# Patient Record
Sex: Female | Born: 1995
Health system: Southern US, Community
[De-identification: ages and names within clinical notes are randomized; demographics above are authoritative.]

## PROBLEM LIST (undated history)

## (undated) DIAGNOSIS — I2699 Other pulmonary embolism without acute cor pulmonale: Secondary | ICD-10-CM

## (undated) DIAGNOSIS — J020 Streptococcal pharyngitis: Secondary | ICD-10-CM

## (undated) DIAGNOSIS — J189 Pneumonia, unspecified organism: Secondary | ICD-10-CM

## (undated) DIAGNOSIS — K5190 Ulcerative colitis, unspecified, without complications: Secondary | ICD-10-CM

## (undated) HISTORY — PX: CARDIAC SURGERY: SHX584

---

## 2017-10-09 ENCOUNTER — Encounter (HOSPITAL_BASED_OUTPATIENT_CLINIC_OR_DEPARTMENT_OTHER): Payer: Self-pay | Admitting: *Deleted

## 2017-10-09 ENCOUNTER — Ambulatory Visit (HOSPITAL_BASED_OUTPATIENT_CLINIC_OR_DEPARTMENT_OTHER)
Admission: RE | Admit: 2017-10-09 | Discharge: 2017-10-09 | Disposition: A | Discharge status: Home / Self Care | Payer: 59 | Source: Ambulatory Visit | Attending: Emergency Medicine | Admitting: Emergency Medicine

## 2017-10-09 ENCOUNTER — Other Ambulatory Visit (HOSPITAL_BASED_OUTPATIENT_CLINIC_OR_DEPARTMENT_OTHER): Payer: Self-pay | Admitting: Emergency Medicine

## 2017-10-09 ENCOUNTER — Inpatient Hospital Stay (HOSPITAL_BASED_OUTPATIENT_CLINIC_OR_DEPARTMENT_OTHER)
Admission: EM | Admit: 2017-10-09 | Discharge: 2017-10-11 | DRG: 176 | Disposition: A | Discharge status: Home / Self Care | Payer: 59 | Attending: Family Medicine | Admitting: Family Medicine

## 2017-10-09 ENCOUNTER — Encounter (HOSPITAL_BASED_OUTPATIENT_CLINIC_OR_DEPARTMENT_OTHER): Payer: Self-pay

## 2017-10-09 DIAGNOSIS — Z880 Allergy status to penicillin: Secondary | ICD-10-CM | POA: Diagnosis not present

## 2017-10-09 DIAGNOSIS — R0602 Shortness of breath: Secondary | ICD-10-CM | POA: Diagnosis not present

## 2017-10-09 DIAGNOSIS — I2692 Saddle embolus of pulmonary artery without acute cor pulmonale: Secondary | ICD-10-CM | POA: Diagnosis present

## 2017-10-09 DIAGNOSIS — J9811 Atelectasis: Secondary | ICD-10-CM | POA: Diagnosis present

## 2017-10-09 DIAGNOSIS — J90 Pleural effusion, not elsewhere classified: Secondary | ICD-10-CM | POA: Diagnosis present

## 2017-10-09 DIAGNOSIS — Z79899 Other long term (current) drug therapy: Secondary | ICD-10-CM | POA: Diagnosis not present

## 2017-10-09 DIAGNOSIS — J181 Lobar pneumonia, unspecified organism: Secondary | ICD-10-CM | POA: Diagnosis not present

## 2017-10-09 DIAGNOSIS — D509 Iron deficiency anemia, unspecified: Secondary | ICD-10-CM | POA: Diagnosis present

## 2017-10-09 DIAGNOSIS — I2699 Other pulmonary embolism without acute cor pulmonale: Secondary | ICD-10-CM | POA: Diagnosis not present

## 2017-10-09 DIAGNOSIS — R0789 Other chest pain: Secondary | ICD-10-CM

## 2017-10-09 DIAGNOSIS — E871 Hypo-osmolality and hyponatremia: Secondary | ICD-10-CM | POA: Diagnosis present

## 2017-10-09 DIAGNOSIS — R7989 Other specified abnormal findings of blood chemistry: Secondary | ICD-10-CM

## 2017-10-09 DIAGNOSIS — D649 Anemia, unspecified: Secondary | ICD-10-CM | POA: Diagnosis not present

## 2017-10-09 DIAGNOSIS — J189 Pneumonia, unspecified organism: Secondary | ICD-10-CM

## 2017-10-09 DIAGNOSIS — K5190 Ulcerative colitis, unspecified, without complications: Secondary | ICD-10-CM | POA: Diagnosis present

## 2017-10-09 HISTORY — DX: Ulcerative colitis, unspecified, without complications: K51.90

## 2017-10-09 HISTORY — DX: Pneumonia, unspecified organism: J18.9

## 2017-10-09 LAB — COMPREHENSIVE METABOLIC PANEL
ALT: 12 U/L — AB (ref 14–54)
AST: 15 U/L (ref 15–41)
Albumin: 3.8 g/dL (ref 3.5–5.0)
Alkaline Phosphatase: 88 U/L (ref 38–126)
Anion gap: 8 (ref 5–15)
BILIRUBIN TOTAL: 0.3 mg/dL (ref 0.3–1.2)
BUN: 13 mg/dL (ref 6–20)
CHLORIDE: 102 mmol/L (ref 101–111)
CO2: 24 mmol/L (ref 22–32)
CREATININE: 0.69 mg/dL (ref 0.44–1.00)
Calcium: 9.2 mg/dL (ref 8.9–10.3)
GFR calc Af Amer: >60 mL/min (ref >60)
GLUCOSE: 90 mg/dL (ref 65–99)
Potassium: 3.7 mmol/L (ref 3.5–5.1)
Sodium: 134 mmol/L — ABNORMAL LOW (ref 135–145)
Total Protein: 8 g/dL (ref 6.5–8.1)

## 2017-10-09 LAB — OCCULT BLOOD X 1 CARD TO LAB, STOOL: Fecal Occult Bld: NEGATIVE

## 2017-10-09 LAB — HCG, QUANTITATIVE, PREGNANCY: HCG, BETA CHAIN, QUANT, S: 1 m[IU]/mL (ref <5)

## 2017-10-09 MED ORDER — ACETAMINOPHEN 325 MG PO TABS
650.0000 mg | ORAL_TABLET | Freq: Four times a day (QID) | ORAL | Status: DC | PRN
  Administered 2017-10-11: 650 mg via ORAL
  Filled 2017-10-09: qty 2

## 2017-10-09 MED ORDER — IOPAMIDOL (ISOVUE-370) INJECTION 76%
100.0000 mL | Freq: Once | INTRAVENOUS | Status: AC | PRN
  Administered 2017-10-09: 100 mL via INTRAVENOUS

## 2017-10-09 MED ORDER — HEPARIN BOLUS VIA INFUSION
4000.0000 [IU] | Freq: Once | INTRAVENOUS | Status: AC
Start: 2017-10-09 — End: 2017-10-09
  Administered 2017-10-09: 4000 [IU] via INTRAVENOUS

## 2017-10-09 MED ORDER — SODIUM CHLORIDE 0.9 % IV SOLN
INTRAVENOUS | Status: DC
  Administered 2017-10-09: 22:00:00 via INTRAVENOUS

## 2017-10-09 MED ORDER — HEPARIN (PORCINE) IN NACL 100-0.45 UNIT/ML-% IJ SOLN
1200.0000 [IU]/h | INTRAMUSCULAR | Status: DC
  Administered 2017-10-09: 1200 [IU]/h via INTRAVENOUS
  Filled 2017-10-09: qty 250

## 2017-10-09 MED ORDER — MESALAMINE 1.2 G PO TBEC
1.2000 g | DELAYED_RELEASE_TABLET | Freq: Every day | ORAL | Status: DC
  Filled 2017-10-09: qty 1

## 2017-10-09 MED ORDER — ACETAMINOPHEN 650 MG RE SUPP: 650.0000 mg | Freq: Four times a day (QID) | RECTAL | Status: DC | PRN

## 2017-10-09 NOTE — ED Notes (Signed)
[facility]. No distress. texting with her legs up resting on chair.

## 2017-10-09 NOTE — Progress Notes (Signed)
ANTICOAGULATION CONSULT NOTE - Initial Consult  Pharmacy Consult for heparin Indication: pulmonary embolus  Allergies  Allergen Reactions  . Penicillins Hives    Patient Measurements: Height: 5\' 5"  (165.1 cm) Weight: 165 lb (74.8 kg) IBW/kg (Calculated) : 57 Heparin Dosing Weight: 72.3kg  Vital Signs: Temp: 97.8 F (36.6 C) (11/02 1602) Temp Source: Oral (11/02 1602) BP: 129/78 (11/02 1808) Pulse Rate: 97 (11/02 1808)  Labs:  Recent Labs  10/09/17 1659  HGB 10.3*  HCT 34.2*  PLT 510*  CREATININE 0.69    Estimated Creatinine Clearance: 114.5 mL/min (by C-G formula based on SCr of 0.69 mg/dL).   Medical History: Past Medical History:  Diagnosis Date  . Pneumonia 10/09/2017  . Ulcerative colitis (HCC) 10/09/2017    Medications:  Infusions:  . heparin      Assessment: 21 yof presented to the ED with SOB and known PE. To start IV heparin. Baseline H/H slightly low and platelets are elevated. She is not on anticoagulation PTA.   Goal of Therapy:  Heparin level 0.3-0.7 units/ml Monitor platelets by anticoagulation protocol: Yes   Plan:  Heparin bolus 4000 units IV x 1 Heparin gtt 1200 units/hr Check a 6 hr heparin level Daily heparin level and CBC  Khy Pitre, Drake Leachachel Lynn 10/09/2017,7:02 PM

## 2017-10-09 NOTE — ED Notes (Signed)
Pt ambulated to restroom. 

## 2017-10-09 NOTE — ED Notes (Signed)
Patient denies pain and is resting comfortably.  

## 2017-10-09 NOTE — H&P (Addendum)
TRH H&P   Patient Demographics:    Meagan Clark, is a 21 y.o. female  MRN: [id number]   DOB - Jun 06, 1996  Admit Date - 10/09/2017  Outpatient Primary MD for the patient is Patient, No Pcp Per  Referring MD/NP/PA:  Drema Pry  Outpatient Specialists:   Patient coming from: home  Chief Complaint  Patient presents with  . Shortness of Breath  . Known PE      HPI:    Meagan Clark  is a 21 y.o. female,w ulcerative colitis, w recent dx of pneumonia 9/30 apparently c/o chest pain with breathing and had a ? D dimer test that was positive and sent for CTA chest r/o PE.   CTA chest  IMPRESSION: 1. Pulmonary artery embolism occluding the right main pulmonary artery extending into the branch vessels. 2. Associated atelectasis and possible posterior right lower lobe pulmonary infarct. 3. Right pleural effusion is likely reactive. 4. Pulmonary embolus extends 1 cm into the left main pulmonary artery without occlusion. No definite distal emboli are present on the left. 5. No evidence for right heart strain.  Pt sent to ED for evaluation of PE.    In ED,  Wbc 14.2, Hgb 10.3, Plt 510 Na 134, Bun 13, Creatinne 0.69,  Ast 15, Alt 12  Hemeoccult stool negative  Pt will be admitted for pulmonary embolism      Review of systems:    In addition to the HPI above,    No Fever-chills, No Headache, No changes with Vision or hearing, No problems swallowing food or Liquids, No Chest pain, Cough or Shortness of Breath, No Abdominal pain, No Nausea or Vommitting, Bowel movements are regular, No Blood in stool or Urine, No dysuria, No new skin rashes or bruises, No new joints pains-aches,  No new weakness, tingling, numbness in any extremity, No recent weight gain or loss, No polyuria, polydypsia or polyphagia, No significant Mental Stressors.  A full 10 point  Review of Systems was done, except as stated above, all other Review of Systems were negative.   With Past History of the following :    Past Medical History:  Diagnosis Date  . Pneumonia 10/09/2017  . Ulcerative colitis (HCC) 10/09/2017      History reviewed. No pertinent surgical history.    Social History:     Social History  Substance Use Topics  . Smoking status: Never Smoker  . Smokeless tobacco: Never Used  . Alcohol use No     Lives -  At home  Mobility -   Walks by self   Family History :    No family history on file.    Home Medications:   Prior to Admission medications   Medication Sig Start Date End Date Taking? Authorizing Provider  mesalamine (LIALDA) 1.2 g EC tablet Take by mouth daily with breakfast.   Yes [provider]  Allergies:     Allergies  Allergen Reactions  . Penicillins Hives     Physical Exam:   Vitals  Blood pressure (!) 144/82, pulse 88, temperature 98.6 F (37 C), temperature source Oral, resp. rate 13, height 5\' 5"  (1.651 m), weight 72.9 kg (160 lb 11.5 oz), last menstrual period 09/21/2017, SpO2 98 %.   1. General  lying in bed in NAD,    2. Normal affect and insight, Not Suicidal or Homicidal, Awake Alert, Oriented X 3.  3. No F.N deficits, ALL C.Nerves Intact, Strength 5/5 all 4 extremities, Sensation intact all 4 extremities, Plantars down going.  4. Ears and Eyes appear Normal, Conjunctivae clear, PERRLA. Moist Oral Mucosa.  5. Supple Neck, No JVD, No cervical lymphadenopathy appriciated, No Carotid Bruits.  6. Symmetrical Chest wall movement, Good air movement bilaterally, CTAB.  7. RRR, No Gallops, Rubs or Murmurs, No Parasternal Heave.  8. Positive Bowel Sounds, Abdomen Soft, No tenderness, No organomegaly appriciated,No rebound -guarding or rigidity.  9.  No Cyanosis, Normal Skin Turgor, No Skin Rash or Bruise.  10. Good muscle tone,  joints appear normal , no effusions, Normal  ROM.  11. No Palpable Lymph Nodes in Neck or Axillae     Data Review:    CBC  Recent Labs Lab 10/09/17 1659  WBC 14.2*  HGB 10.3*  HCT 34.2*  PLT 510*  MCV 67.3*  MCH 20.3*  MCHC 30.1  RDW 17.8*  LYMPHSABS 3.1  MONOABS 0.7  EOSABS 0.8*  BASOSABS 0   ------------------------------------------------------------------------------------------------------------------  Chemistries   Recent Labs Lab 10/09/17 1659  NA 134*  K 3.7  CL 102  CO2 24  GLUCOSE 90  BUN 13  CREATININE 0.69  CALCIUM 9.2  AST 15  ALT 12*  ALKPHOS 88  BILITOT 0.3   ------------------------------------------------------------------------------------------------------------------ estimated creatinine clearance is 113.2 mL/min (by C-G formula based on SCr of 0.69 mg/dL). ------------------------------------------------------------------------------------------------------------------ No results for input(s): TSH, T4TOTAL, T3FREE, THYROIDAB in the last 72 hours.  Invalid input(s): FREET3  Coagulation profile No results for input(s): INR, PROTIME in the last 168 hours. ------------------------------------------------------------------------------------------------------------------- No results for input(s): DDIMER in the last 72 hours. -------------------------------------------------------------------------------------------------------------------  Cardiac Enzymes No results for input(s): CKMB, TROPONINI, MYOGLOBIN in the last 168 hours.  Invalid input(s): CK ------------------------------------------------------------------------------------------------------------------ No results found for: BNP   ---------------------------------------------------------------------------------------------------------------  Urinalysis No results found for: COLORURINE, APPEARANCEUR, LABSPEC, PHURINE, GLUCOSEU, HGBUR, BILIRUBINUR, KETONESUR, PROTEINUR, UROBILINOGEN, NITRITE,  LEUKOCYTESUR  ----------------------------------------------------------------------------------------------------------------   Imaging Results:    Ct Angio Chest Pe W Or Wo Contrast  Result Date: 10/09/2017 CLINICAL DATA:  Progressive shortness of breath. Chest pressure. Positive D-dimer. EXAM: CT ANGIOGRAPHY CHEST WITH CONTRAST TECHNIQUE: Multidetector CT imaging of the chest was performed using the standard protocol during bolus administration of intravenous contrast. Multiplanar CT image reconstructions and MIPs were obtained to evaluate the vascular anatomy. CONTRAST:  100 mL Isovue 370 COMPARISON:  None. FINDINGS: Cardiovascular: The heart size is normal. There is no evidence for right heart strain. Aorta and great vessel origins are within normal limits. Pulmonary artery opacification is excellent. There is a large embolus occluding the right main pulmonary artery and extending into the lobar and segmental branch vessels on the right. The embolus spans the carina extending into the left main pulmonary artery without proximal occlusion. No definite distal emboli are present. Mediastinum/Nodes: No significant mediastinal or axillary adenopathy is present. The esophagus is within normal limits. Lungs/Pleura: Scattered areas of ground-glass attenuation are present in the right lung, likely reflect atelectasis.  There is focal peripheral based airspace disease in the posterior right lower lobe 16 mm, likely pulmonary infarct. A right pleural effusion is present. The left lung is clear. Upper Abdomen: Limited imaging the abdomen is unremarkable. Musculoskeletal: No chest wall abnormality. No acute or significant osseous findings. Review of the MIP images confirms the above findings. IMPRESSION: 1. Pulmonary artery embolism occluding the right main pulmonary artery extending into the branch vessels. 2. Associated atelectasis and possible posterior right lower lobe pulmonary infarct. 3. Right pleural effusion  is likely reactive. 4. Pulmonary embolus extends 1 cm into the left main pulmonary artery without occlusion. No definite distal emboli are present on the left. 5. No evidence for right heart strain. Critical Value/emergent results were called by telephone at the time of interpretation on 10/09/2017 at 3:27 pm to Dr. Sherre Poot , who verbally acknowledged these results. Electronically Signed   By: Marin Roberts M.D.   On: 10/09/2017 15:34      Assessment & Plan:    Principal Problem:   Pulmonary embolism (HCC) Active Problems:   Anemia   Hyponatremia   PE Check hypercoag panel Lower ext ultrasound Heparin iv Check cardiac echo  Hyponatremia Hydrate with ns iv Check cmp in am  Anemia Check cbc in am  UC  Cont lialda   DVT Prophylaxis Heparin -  SCDs  AM Labs Ordered, also please review Full Orders  Family Communication: Admission, patients condition and plan of care including tests being ordered have been discussed with the patient  who indicate understanding and agree with the plan and Code Status.  Code Status FULL CODE  Likely DC to  home  Condition GUARDED   Consults called: none  Admission status: inpatient   Time spent in minutes : 45   Pearson Grippe M.D on 10/09/2017 at 9:44 PM  Between 7am to 7pm - Pager - [phone]  . After 7pm go to [email or web address] - password [facility]  [facility] - Office  [phone]

## 2017-10-09 NOTE — Progress Notes (Signed)
21 yo female with recent pneumonia has PE

## 2017-10-09 NOTE — ED Provider Notes (Signed)
[facility] EMERGENCY DEPARTMENT Provider Note  CSN: [id number] Arrival date & time: 10/09/17 1550  Chief Complaint(s) Shortness of Breath and Known PE  HPI Meagan Clark is a 19 y.o. female 21 year old female who presents to the emergency department with new diagnosis of pulmonary embolism noted on CT scan here at [facility] outpatient CT scanner.  Patient was seen by [facility] urgent care center for follow-up of several months of shortness of breath.  She reports that she was diagnosed with pneumonia in late September and reported that her shortness of breath had not improved so she went there for follow-up.  They did blood work that was concerning for PE and ordered a CT scan.  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  She lives in [place] and has been traveling back home almost every month.  She also endorses recently being diagnosed with ulcerative colitis that has been well controlled with immunosuppressive medication.  She endorses pleuritic chest pain with deep breathing.  Also endorses dyspnea on exertion.  Currently she is asymptomatic.  Denies any history of oral contraceptives or personal history of cancer.    HPI  Past Medical History Past Medical History:  Diagnosis Date  . Pneumonia 10/09/2017  . Ulcerative colitis (HCC) 10/09/2017   Patient Active Problem List   Diagnosis Date Noted  . Ulcerative colitis (HCC) 10/09/2017   Home Medication(s) Prior to Admission medications   Medication Sig Start Date End Date Taking? Authorizing Provider  mesalamine (LIALDA) 1.2 g EC tablet Take by mouth daily with breakfast.   Yes [provider]                                                                                                                                    Past Surgical History History reviewed. No pertinent surgical history. Family History No family history on file.  Social History Social History    Substance Use Topics  . Smoking status: Never Smoker  . Smokeless tobacco: Never Used  . Alcohol use No   Allergies Penicillins  Review of Systems Review of Systems All other systems are reviewed and are negative for acute change except as noted in the HPI  Physical Exam Vital Signs  I have reviewed the triage vital signs BP 118/72 (BP Location: Left Arm)   Pulse 98   Temp 97.8 F (36.6 C) (Oral)   Resp 20   Ht 5\' 5"  (1.651 m)   Wt 74.8 kg (165 lb)   LMP 09/21/2017   SpO2 98%   BMI 27.46 kg/m   Physical Exam  Constitutional: She is oriented to person, place, and time. She appears well-developed and well-nourished. No distress.  HENT:  Head: Normocephalic and atraumatic.  Nose: Nose normal.  Eyes: Pupils are equal, round, and reactive to light. Conjunctivae and EOM are normal. Right eye exhibits no discharge. Left eye exhibits  no discharge. No scleral icterus.  Neck: Normal range of motion. Neck supple.  Cardiovascular: Normal rate and regular rhythm.  Exam reveals no gallop and no friction rub.   Murmur heard.  Systolic ( Flow murmur best heard in right lower sternal border) murmur is present  Pulmonary/Chest: Effort normal and breath sounds normal. No stridor. No respiratory distress. She has no rales.  Abdominal: Soft. She exhibits no distension. There is no tenderness.  Musculoskeletal: She exhibits no edema or tenderness.  Neurological: She is alert and oriented to person, place, and time.  Skin: Skin is warm and dry. No rash noted. She is not diaphoretic. No erythema.  Psychiatric: She has a normal mood and affect.  Vitals reviewed.   ED Results and Treatments Labs (all labs ordered are listed, but only abnormal results are displayed) Labs Reviewed  CBC WITH DIFFERENTIAL/PLATELET - Abnormal; Notable for the following:       Result Value   WBC 14.2 (*)    Hemoglobin 10.3 (*)    HCT 34.2 (*)    MCV 67.3 (*)    MCH 20.3 (*)    RDW 17.8 (*)    Platelets  510 (*)    Neutro Abs 9.4 (*)    Eosinophils Absolute 0.8 (*)    All other components within normal limits  COMPREHENSIVE METABOLIC PANEL - Abnormal; Notable for the following:    Sodium 134 (*)    ALT 12 (*)    All other components within normal limits  HCG, QUANTITATIVE, PREGNANCY  OCCULT BLOOD X 1 CARD TO LAB, STOOL                                                                                                                         EKG  EKG Interpretation  Date/Time:    Ventricular Rate:    PR Interval:    QRS Duration:   QT Interval:    QTC Calculation:   R Axis:     Text Interpretation:        Radiology Ct Angio Chest Pe W Or Wo Contrast  Result Date: 10/09/2017 CLINICAL DATA:  Progressive shortness of breath. Chest pressure. Positive D-dimer. EXAM: CT ANGIOGRAPHY CHEST WITH CONTRAST TECHNIQUE: Multidetector CT imaging of the chest was performed using the standard protocol during bolus administration of intravenous contrast. Multiplanar CT image reconstructions and MIPs were obtained to evaluate the vascular anatomy. CONTRAST:  100 mL Isovue 370 COMPARISON:  None. FINDINGS: Cardiovascular: The heart size is normal. There is no evidence for right heart strain. Aorta and great vessel origins are within normal limits. Pulmonary artery opacification is excellent. There is a large embolus occluding the right main pulmonary artery and extending into the lobar and segmental branch vessels on the right. The embolus spans the carina extending into the left main pulmonary artery without proximal occlusion. No definite distal emboli are present. Mediastinum/Nodes: No significant mediastinal or axillary adenopathy is present. The esophagus is within normal limits. Lungs/Pleura: Scattered areas  of ground-glass attenuation are present in the right lung, likely reflect atelectasis. There is focal peripheral based airspace disease in the posterior right lower lobe 16 mm, likely pulmonary infarct.  A right pleural effusion is present. The left lung is clear. Upper Abdomen: Limited imaging the abdomen is unremarkable. Musculoskeletal: No chest wall abnormality. No acute or significant osseous findings. Review of the MIP images confirms the above findings. IMPRESSION: 1. Pulmonary artery embolism occluding the right main pulmonary artery extending into the branch vessels. 2. Associated atelectasis and possible posterior right lower lobe pulmonary infarct. 3. Right pleural effusion is likely reactive. 4. Pulmonary embolus extends 1 cm into the left main pulmonary artery without occlusion. No definite distal emboli are present on the left. 5. No evidence for right heart strain. Critical Value/emergent results were called by telephone at the time of interpretation on 10/09/2017 at 3:27 pm to Dr. Sherre PootLAURENCE SCHLANGER , who verbally acknowledged these results. Electronically Signed   By: Marin Robertshristopher  Mattern M.D.   On: 10/09/2017 15:34   Pertinent labs & imaging results that were available during my care of the patient were reviewed by me and considered in my medical decision making (see chart for details).  Medications Ordered in ED Medications  heparin bolus via infusion 4,000 Units (not administered)  heparin ADULT infusion 100 units/mL (25000 units/24550mL sodium chloride 0.45%) (not administered)                                                                                                                                    Procedures Procedures  CRITICAL CARE Performed by: Amadeo GarnetPedro Eduardo Melton Walls Total critical care time: 30 minutes Critical care time was exclusive of separately billable procedures and treating other patients. Critical care was necessary to treat or prevent imminent or life-threatening deterioration. Critical care was time spent personally by me on the following activities: development of treatment plan with patient and/or surrogate as well as nursing, discussions with consultants,  evaluation of patient's response to treatment, examination of patient, obtaining history from patient or surrogate, ordering and performing treatments and interventions, ordering and review of laboratory studies, ordering and review of radiographic studies, pulse oximetry and re-evaluation of patient's condition.   (including critical care time)  Medical Decision Making / ED Course I have reviewed the nursing notes for this encounter and the patient's prior records (if available in EHR or on provided paperwork).    Known PE on CT scan.  Patient is hemodynamically stable satting well on room air.  Given her lack of close follow-up and history of ulcerative colitis, patient is not appropriate for discharge with outpatient management.  Will obtain screening labs to assess for any thrombocytopenia or renal insufficiency.  We will also obtain Hemoccult and UPT.  If screening labs unremarkable patient will be started on heparin and admitted for further management.  Labs grossly reassuring without thrombocytopenia or renal.  Beta hCG negative.  Hemoccult negative.  Patient started on heparin drip.  Will discuss case with medicine for admission.  Final Clinical Impression(s) / ED Diagnoses Final diagnoses:  Acute saddle pulmonary embolism without acute cor pulmonale (HCC)      This chart was dictated using voice recognition software.  Despite best efforts to proofread,  errors can occur which can change the documentation meaning.   Nira Conn, MD 10/09/17 1904

## 2017-10-09 NOTE — ED Triage Notes (Signed)
SOB since September. She has been diagnosed with pneumonia in September. She was rechecked 2 days ago and had a CT today that showed a PE.

## 2017-10-10 ENCOUNTER — Inpatient Hospital Stay (HOSPITAL_COMMUNITY): Payer: 59

## 2017-10-10 DIAGNOSIS — R0602 Shortness of breath: Secondary | ICD-10-CM

## 2017-10-10 DIAGNOSIS — D649 Anemia, unspecified: Secondary | ICD-10-CM

## 2017-10-10 DIAGNOSIS — K5190 Ulcerative colitis, unspecified, without complications: Secondary | ICD-10-CM

## 2017-10-10 DIAGNOSIS — I2699 Other pulmonary embolism without acute cor pulmonale: Secondary | ICD-10-CM

## 2017-10-10 DIAGNOSIS — I2692 Saddle embolus of pulmonary artery without acute cor pulmonale: Principal | ICD-10-CM

## 2017-10-10 LAB — COMPREHENSIVE METABOLIC PANEL
ALBUMIN: 3.7 g/dL (ref 3.5–5.0)
ALT: 11 U/L — AB (ref 14–54)
AST: 17 U/L (ref 15–41)
Alkaline Phosphatase: 93 U/L (ref 38–126)
Anion gap: 9 (ref 5–15)
BILIRUBIN TOTAL: 0.5 mg/dL (ref 0.3–1.2)
BUN: 14 mg/dL (ref 6–20)
CHLORIDE: 103 mmol/L (ref 101–111)
CO2: 25 mmol/L (ref 22–32)
CREATININE: 0.74 mg/dL (ref 0.44–1.00)
Calcium: 9.1 mg/dL (ref 8.9–10.3)
GFR calc Af Amer: >60 mL/min (ref >60)
GLUCOSE: 103 mg/dL — AB (ref 65–99)
Potassium: 3.4 mmol/L — ABNORMAL LOW (ref 3.5–5.1)
Sodium: 137 mmol/L (ref 135–145)
Total Protein: 8.2 g/dL — ABNORMAL HIGH (ref 6.5–8.1)

## 2017-10-10 LAB — CBC
HCT: 33.3 % — ABNORMAL LOW (ref 36.0–46.0)
Hemoglobin: 9.9 g/dL — ABNORMAL LOW (ref 12.0–15.0)
MCH: 20.2 pg — ABNORMAL LOW (ref 26.0–34.0)
MCHC: 29.7 g/dL — AB (ref 30.0–36.0)
MCV: 68.1 fL — AB (ref 78.0–100.0)
PLATELETS: 522 10*3/uL — AB (ref 150–400)
RBC: 4.89 MIL/uL (ref 3.87–5.11)
RDW: 17.3 % — AB (ref 11.5–15.5)
WBC: 16.9 10*3/uL — AB (ref 4.0–10.5)

## 2017-10-10 LAB — HEPARIN LEVEL (UNFRACTIONATED)
Heparin Unfractionated: 0.34 IU/mL (ref 0.30–0.70)
Heparin Unfractionated: 0.4 IU/mL (ref 0.30–0.70)

## 2017-10-10 LAB — MRSA PCR SCREENING: MRSA BY PCR: NEGATIVE

## 2017-10-10 LAB — ECHOCARDIOGRAM COMPLETE
Height: 65 in
Weight: 2504.43 oz

## 2017-10-10 LAB — ANTITHROMBIN III: AntiThromb III Func: 101 % (ref 75–120)

## 2017-10-10 MED ORDER — MESALAMINE 1.2 G PO TBEC
2.4000 g | DELAYED_RELEASE_TABLET | Freq: Every day | ORAL | Status: DC
  Administered 2017-10-10 (×2): 2.4 g via ORAL
  Filled 2017-10-10 (×3): qty 2

## 2017-10-10 MED ORDER — NON FORMULARY: 3.0000 mg | Freq: Every evening | Status: DC | PRN

## 2017-10-10 MED ORDER — DIPHENHYDRAMINE HCL 50 MG/ML IJ SOLN
25.0000 mg | Freq: Four times a day (QID) | INTRAMUSCULAR | Status: DC | PRN
  Administered 2017-10-10 – 2017-10-11 (×2): 25 mg via INTRAVENOUS
  Filled 2017-10-10 (×2): qty 1

## 2017-10-10 MED ORDER — HYDROMORPHONE HCL 1 MG/ML IJ SOLN
0.5000 mg | INTRAMUSCULAR | Status: DC | PRN
  Administered 2017-10-10 – 2017-10-11 (×3): 0.5 mg via INTRAVENOUS
  Filled 2017-10-10 (×2): qty 1
  Filled 2017-10-10: qty 0.5

## 2017-10-10 MED ORDER — OXYCODONE HCL 5 MG PO TABS
5.0000 mg | ORAL_TABLET | ORAL | Status: DC | PRN
  Administered 2017-10-10 – 2017-10-11 (×3): 5 mg via ORAL
  Filled 2017-10-10 (×4): qty 1

## 2017-10-10 MED ORDER — HEPARIN (PORCINE) IN NACL 100-0.45 UNIT/ML-% IJ SOLN
1300.0000 [IU]/h | INTRAMUSCULAR | Status: DC
  Administered 2017-10-10 – 2017-10-11 (×3): 1300 [IU]/h via INTRAVENOUS
  Filled 2017-10-10 (×2): qty 250

## 2017-10-10 MED ORDER — HYDROCORTISONE 0.5 % EX CREA
TOPICAL_CREAM | Freq: Two times a day (BID) | CUTANEOUS | Status: DC | PRN
  Filled 2017-10-10: qty 28.35

## 2017-10-10 MED ORDER — MORPHINE SULFATE (PF) 4 MG/ML IV SOLN
2.0000 mg | Freq: Once | INTRAVENOUS | Status: AC
  Administered 2017-10-10: 2 mg via INTRAVENOUS
  Filled 2017-10-10: qty 1

## 2017-10-10 NOTE — Progress Notes (Signed)
[place] arrived [place] via [place] [address] [place].  Mother at bedside.  Telemetry placed per MD order and CMT notified.

## 2017-10-10 NOTE — Progress Notes (Signed)
ANTICOAGULATION CONSULT NOTE - f/u Consult  Pharmacy Consult for heparin Indication: pulmonary embolus  Allergies  Allergen Reactions  . Penicillins Hives    Has patient had a PCN reaction causing immediate rash, facial/tongue/throat swelling, SOB or lightheadedness with hypotension:no Has patient had a PCN reaction causing severe rash involve mucus membranes or skin necrosis: no Has patient had a PCN reaction that required hospitalization: no Has patient had a PCN reaction occurring within the last 10 years:no If all of the above answers are "NO", then may proceed with Cephalosporin use.     Patient Measurements: Height: 5\' 5"  (165.1 cm) Weight: 160 lb 11.5 oz (72.9 kg) IBW/kg (Calculated) : 57 Heparin Dosing Weight: 72.3kg  Vital Signs: Temp: 98.1 F (36.7 C) (11/03 0000) Temp Source: Oral (11/03 0000) BP: 114/71 (11/03 0000) Pulse Rate: 100 (11/03 0000)  Labs:  Recent Labs  10/09/17 1659 10/10/17 0151  HGB 10.3* 9.9*  HCT 34.2* 33.3*  PLT 510* 522*  HEPARINUNFRC  --  0.34  CREATININE 0.69 0.74    Estimated Creatinine Clearance: 113.2 mL/min (by C-G formula based on SCr of 0.74 mg/dL).   Medical History: Past Medical History:  Diagnosis Date  . Pneumonia 10/09/2017  . Ulcerative colitis (HCC) 10/09/2017    Medications:  Infusions:  . sodium chloride 75 mL/hr at 10/09/17 2220  . heparin      Assessment: 21 yof presented to the ED with SOB and known PE. To start IV heparin. Baseline H/H slightly low and platelets are elevated. She is not on anticoagulation PTA.  Today, 11/3 0151 HL= 0.34 at low end of goal, no infusion or bleeding issues per RN.   Goal of Therapy:  Heparin level 0.3-0.7 units/ml Monitor platelets by anticoagulation protocol: Yes   Plan:  Increase heparin drip to 1300 units/hr to ensure stays in therapeutic range as bolus wears off Check a 6 hr heparin level Daily heparin level and CBC  Susanne GreenhouseGreen, [place] Skalicky R 10/10/2017,4:00 AM

## 2017-10-10 NOTE — Progress Notes (Signed)
ANTICOAGULATION CONSULT NOTE - f/u Consult  Pharmacy Consult for heparin Indication: pulmonary embolus  Allergies  Allergen Reactions  . Penicillins Hives    Has patient had a PCN reaction causing immediate rash, facial/tongue/throat swelling, SOB or lightheadedness with hypotension:no Has patient had a PCN reaction causing severe rash involve mucus membranes or skin necrosis: no Has patient had a PCN reaction that required hospitalization: no Has patient had a PCN reaction occurring within the last 10 years:no If all of the above answers are "NO", then may proceed with Cephalosporin use.     Patient Measurements: Height: 5\' 5"  (165.1 cm) Weight: 156 lb 8.4 oz (71 kg) IBW/kg (Calculated) : 57 Heparin Dosing Weight: 72.3kg  Vital Signs: Temp: 97.6 F (36.4 C) (11/03 0800) Temp Source: Oral (11/03 0800) BP: 132/65 (11/03 0840) Pulse Rate: 82 (11/03 0840)  Labs:  Recent Labs  10/09/17 1659 10/10/17 0151 10/10/17 1102  HGB 10.3* 9.9*  --   HCT 34.2* 33.3*  --   PLT 510* 522*  --   HEPARINUNFRC  --  0.34 0.40  CREATININE 0.69 0.74  --     Estimated Creatinine Clearance: 111.8 mL/min (by C-G formula based on SCr of 0.74 mg/dL).   Medical History: Past Medical History:  Diagnosis Date  . Pneumonia 10/09/2017  . Ulcerative colitis (HCC) 10/09/2017    Medications:  Infusions:  . heparin 1,300 Units/hr (10/10/17 1015)    Assessment: 21 yof presented to the ED with SOB and known PE. To start IV heparin. Baseline H/H slightly low and platelets are elevated. She is not on anticoagulation PTA.    Today, 11/3  0151 HL= 0.34 at low end of goal, no infusion or bleeding issues per RN.  Hgb 9.9 low but stable  Plt 522   SCr 0.74, CrCl > 100 ml/min  No bleeding or line issues per RN      Goal of Therapy:  Heparin level 0.3-0.7 units/ml Monitor platelets by anticoagulation protocol: Yes   Plan:   HL is 0.40, therapeutic. Continue heparin drip to 1300  units/hr  Daily heparin level and CBC  Monitor for signs and symptoms of bleeding  F/u about transition to DOAC     Adalberto ColeNikola Dabney Schanz, PharmD, BCPS Pager [phone] 10/10/2017 12:42 PM

## 2017-10-10 NOTE — Progress Notes (Signed)
  Echocardiogram 2D Echocardiogram has been performed.  Shatara Stanek T Suhaila Troiano 10/10/2017, 8:41 AM

## 2017-10-10 NOTE — Progress Notes (Signed)
Preliminary results by tech - Venous Duplex Lower Ext. Completed. Negative for deep and superficial vein thrombosis in both legs.  Junnie Loschiavo, BS, RDMS, RVT  

## 2017-10-10 NOTE — Progress Notes (Signed)
PHARMACIST - PHYSICIAN ORDER COMMUNICATION  CONCERNING: P&T Medication Policy on Herbal Medications  DESCRIPTION:  This patient's order for:  Melatonin  has been noted.  This product(s) is classified as an "herbal" or natural product. Due to a lack of definitive safety studies or FDA approval, nonstandard manufacturing practices, plus the potential risk of unknown drug-drug interactions while on inpatient medications, the Pharmacy and Therapeutics Committee does not permit the use of "herbal" or natural products of this type within [facility].   ACTION TAKEN: The pharmacy department is unable to verify this order at this time and your patient has been informed of this safety policy. Please reevaluate patient's clinical condition at discharge and address if the herbal or natural product(s) should be resumed at that time.   Thanks Lorenza EvangelistGreen, Vanellope Passmore R 10/10/2017 12:21 AM

## 2017-10-10 NOTE — Progress Notes (Signed)
PROGRESS NOTE  Meagan Clark  ZOX:[id number] DOB: 12/21/1995 DOA: 10/09/2017 PCP: In [place] Outpatient Specialists: GI in [place] Brief Narrative: Meagan Clark is a 21 y.o. 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 She'd been diagnosed in late September with pneumonia without significant improvement over the next month despite antibiotics. She presented to urgent care where she was found to have a positive D-dimer and was sent to the ED where CTA demonstrated PE on the right main pulmonary artery extending distally with likely infarct and leftward nonocclusive extension and no evidence of right heart strain. She is not hypoxic at rest, but has pleuritic chest pain and exertional dyspnea. Heparin was started after UPT and FOBT were engative, and she was admitted to the SDU. She has remained hemodynamically stable.   Assessment & Plan: Principal Problem:   Pulmonary embolism (HCC) Active Problems:   Anemia   Hyponatremia  Pulmonary emboli: In setting of IBD.  - Heparin gtt, will transition to DOAC after 24 hrs. Discussed with patient. Would favor eliquis due to slightly lower risk of GI bleeding. She states BID dosing would not be a problem, but wishes to discuss with her mother.  - LE dopplers ordered, mild LLE swelling without tenderness.  - Hypercoagulable panel was sent. - Follow up echocardiogram  Ulcerative colitis: Stable.  - Continue mesalamine - Monitor for GI bleeding  Microcytic anemia:  - Stable, monitor CBC in AM and check iron panel.   Leukocytosis: Suspect this is reactive.  - Monitor  DVT prophylaxis: Heparin gtt Code Status: Full Family Communication: Mother coming from [place] today. Disposition Plan: Transfer to floor today, DC home in AM if no bleeding, hgb/pulmonary status stable.  Consultants:   Pharmacy  Procedures:    Echocardiogram 11/3  LE dopplers 11/3  Antimicrobials:  None   Subjective: Pleuritic chest pain R > L at bases radiating across right chest is stable. Has not gotten to the bathroom this morning, but does not feel short of breath at rest. Denies recent or current GI or vaginal bleeding.   Objective: Vitals:   10/10/17 0000 10/10/17 0400 10/10/17 0457 10/10/17 0800  BP: 114/71 120/65    Pulse: 100 84    Resp: (!) 35     Temp: 98.1 F (36.7 C) 98 F (36.7 C)  97.6 F (36.4 C)  TempSrc: Oral Oral  Oral  SpO2: 100% 97%    Weight:   71 kg (156 lb 8.4 oz)   Height:        Intake/Output Summary (Last 24 hours) at 10/10/17 0939 Last data filed at 10/10/17 0404  Gross per 24 hour  Intake            656.6 ml  Output                0 ml  Net            656.6 ml   Filed Weights   10/09/17 1601 10/09/17 2137 10/10/17 0457  Weight: 74.8 kg (165 lb) 72.9 kg (160 lb 11.5 oz) 71 kg (156 lb 8.4 oz)    Gen: 21 y.o. female in no distress Pulm: Non-labored breathing room air. Clear to auscultation bilaterally.  CV: Regular rate and rhythm. No murmur, rub, or gallop. No JVD GI: Abdomen soft, non-tender, non-distended, with normoactive bowel sounds. No organomegaly or masses felt. Ext: Warm,  no deformities. Subtle nonpitting edema at left calf compared to right. Negative homan's.  Skin: No rashes, lesions no ulcers Neuro: Alert and oriented. No focal neurological deficits. Psych: Judgement and insight appear normal. Mood & affect appropriate.   Data Reviewed: I have personally reviewed following labs and imaging studies  CBC:  Recent Labs Lab 10/09/17 1659 10/10/17 0151  WBC 14.2* 16.9*  NEUTROABS 9.4*  --   HGB 10.3* 9.9*  HCT 34.2* 33.3*  MCV 67.3* 68.1*  PLT 510* 522*   Basic Metabolic Panel:  Recent Labs Lab 10/09/17 1659 10/10/17 0151  NA 134* 137  K 3.7 3.4*  CL 102 103  CO2 24 25  GLUCOSE 90 103*  BUN 13 14  CREATININE 0.69 0.74  CALCIUM 9.2 9.1    GFR: Estimated Creatinine Clearance: 111.8 mL/min (by C-G formula based on SCr of 0.74 mg/dL). Liver Function Tests:  Recent Labs Lab 10/09/17 1659 10/10/17 0151  AST 15 17  ALT 12* 11*  ALKPHOS 88 93  BILITOT 0.3 0.5  PROT 8.0 8.2*  ALBUMIN 3.8 3.7   No results for input(s): LIPASE, AMYLASE in the last 168 hours. No results for input(s): AMMONIA in the last 168 hours. Coagulation Profile: No results for input(s): INR, PROTIME in the last 168 hours. Cardiac Enzymes: No results for input(s): CKTOTAL, CKMB, CKMBINDEX, TROPONINI in the last 168 hours. BNP (last 3 results) No results for input(s): PROBNP in the last 8760 hours. HbA1C: No results for input(s): HGBA1C in the last 72 hours. CBG: No results for input(s): GLUCAP in the last 168 hours. Lipid Profile: No results for input(s): CHOL, HDL, LDLCALC, TRIG, CHOLHDL, LDLDIRECT in the last 72 hours. Thyroid Function Tests: No results for input(s): TSH, T4TOTAL, FREET4, T3FREE, THYROIDAB in the last 72 hours. Anemia Panel: No results for input(s): VITAMINB12, FOLATE, FERRITIN, TIBC, IRON, RETICCTPCT in the last 72 hours. Urine analysis: No results found for: COLORURINE, APPEARANCEUR, LABSPEC, PHURINE, GLUCOSEU, HGBUR, BILIRUBINUR, KETONESUR, PROTEINUR, UROBILINOGEN, NITRITE, LEUKOCYTESUR Recent Results (from the past 240 hour(s))  MRSA PCR Screening     Status: None   Collection Time: 10/09/17  9:44 PM  Result Value Ref Range Status   MRSA by PCR NEGATIVE NEGATIVE Final    Comment:        The GeneXpert MRSA Assay (FDA approved for NASAL specimens only), is one component of a comprehensive MRSA colonization surveillance program. It is not intended to diagnose MRSA infection nor to guide or monitor treatment for MRSA infections.       Radiology Studies: Ct Angio Chest Pe W Or Wo Contrast  Result Date: 10/09/2017 CLINICAL DATA:  Progressive shortness of breath. Chest pressure. Positive D-dimer. EXAM: CT  ANGIOGRAPHY CHEST WITH CONTRAST TECHNIQUE: Multidetector CT imaging of the chest was performed using the standard protocol during bolus administration of intravenous contrast. Multiplanar CT image reconstructions and MIPs were obtained to evaluate the vascular anatomy. CONTRAST:  100 mL Isovue 370 COMPARISON:  None. FINDINGS: Cardiovascular: The heart size is normal. There is no evidence for right heart strain. Aorta and great vessel origins are within normal limits. Pulmonary artery opacification is excellent. There is a large embolus occluding the right main pulmonary artery and extending into the lobar and segmental branch vessels on the right. The embolus spans the carina extending into the left main pulmonary artery without proximal occlusion. No definite distal emboli are present. Mediastinum/Nodes: No significant mediastinal or axillary adenopathy is present. The esophagus is within normal limits. Lungs/Pleura: Scattered areas of ground-glass  attenuation are present in the right lung, likely reflect atelectasis. There is focal peripheral based airspace disease in the posterior right lower lobe 16 mm, likely pulmonary infarct. A right pleural effusion is present. The left lung is clear. Upper Abdomen: Limited imaging the abdomen is unremarkable. Musculoskeletal: No chest wall abnormality. No acute or significant osseous findings. Review of the MIP images confirms the above findings. IMPRESSION: 1. Pulmonary artery embolism occluding the right main pulmonary artery extending into the branch vessels. 2. Associated atelectasis and possible posterior right lower lobe pulmonary infarct. 3. Right pleural effusion is likely reactive. 4. Pulmonary embolus extends 1 cm into the left main pulmonary artery without occlusion. No definite distal emboli are present on the left. 5. No evidence for right heart strain. Critical Value/emergent results were called by telephone at the time of interpretation on 10/09/2017 at 3:27  pm to Dr. Sherre Poot , who verbally acknowledged these results. Electronically Signed   By: Marin Roberts M.D.   On: 10/09/2017 15:34    Scheduled Meds: . mesalamine  2.4 g Oral QHS   Continuous Infusions: . sodium chloride 75 mL/hr at 10/09/17 2220  . heparin 1,300 Units/hr (10/10/17 0404)     LOS: 1 day   Time spent: 35 minutes.  Hazeline Junker, MD [facility] Pager [phone]  If 7PM-7AM, please contact night-coverage [email or web address] Password [facility] 10/10/2017, 9:39 AM

## 2017-10-11 ENCOUNTER — Other Ambulatory Visit: Payer: Self-pay

## 2017-10-11 DIAGNOSIS — J181 Lobar pneumonia, unspecified organism: Secondary | ICD-10-CM

## 2017-10-11 DIAGNOSIS — D509 Iron deficiency anemia, unspecified: Secondary | ICD-10-CM

## 2017-10-11 LAB — CBC
HEMATOCRIT: 38.4 % (ref 36.0–46.0)
Hemoglobin: 11.5 g/dL — ABNORMAL LOW (ref 12.0–15.0)
MCH: 20.9 pg — ABNORMAL LOW (ref 26.0–34.0)
MCHC: 29.9 g/dL — AB (ref 30.0–36.0)
MCV: 69.8 fL — ABNORMAL LOW (ref 78.0–100.0)
PLATELETS: 547 10*3/uL — AB (ref 150–400)
RBC: 5.5 MIL/uL — ABNORMAL HIGH (ref 3.87–5.11)
RDW: 17.6 % — AB (ref 11.5–15.5)
WBC: 15.5 10*3/uL — AB (ref 4.0–10.5)

## 2017-10-11 LAB — IRON AND TIBC
IRON: 20 ug/dL — AB (ref 28–170)
Saturation Ratios: 5 % — ABNORMAL LOW (ref 10.4–31.8)
TIBC: 399 ug/dL (ref 250–450)
UIBC: 379 ug/dL

## 2017-10-11 LAB — HEPARIN LEVEL (UNFRACTIONATED): HEPARIN UNFRACTIONATED: 0.43 [IU]/mL (ref 0.30–0.70)

## 2017-10-11 LAB — PROTEIN S, TOTAL: Protein S Ag, Total: 110 % (ref 60–150)

## 2017-10-11 LAB — HIV ANTIBODY (ROUTINE TESTING W REFLEX): HIV Screen 4th Generation wRfx: NONREACTIVE

## 2017-10-11 LAB — FERRITIN: FERRITIN: 28 ng/mL (ref 11–307)

## 2017-10-11 LAB — PROTEIN C ACTIVITY: Protein C Activity: 131 % (ref 73–180)

## 2017-10-11 LAB — PROTEIN S ACTIVITY: PROTEIN S ACTIVITY: 83 % (ref 63–140)

## 2017-10-11 MED ORDER — LEVOFLOXACIN 500 MG PO TABS
500.0000 mg | ORAL_TABLET | Freq: Every day | ORAL | Status: DC
  Administered 2017-10-11: 500 mg via ORAL
  Filled 2017-10-11: qty 1

## 2017-10-11 MED ORDER — LEVOFLOXACIN 500 MG PO TABS: 500.0000 mg | ORAL_TABLET | Freq: Every day | ORAL | 0 refills | Status: DC

## 2017-10-11 MED ORDER — APIXABAN 5 MG PO TABS: 5.0000 mg | ORAL_TABLET | Freq: Two times a day (BID) | ORAL | Status: DC

## 2017-10-11 MED ORDER — ELIQUIS 5 MG VTE STARTER PACK: ORAL_TABLET | ORAL | 0 refills | Status: DC

## 2017-10-11 MED ORDER — APIXABAN 5 MG PO TABS
10.0000 mg | ORAL_TABLET | Freq: Two times a day (BID) | ORAL | Status: DC
  Administered 2017-10-11: 10 mg via ORAL
  Filled 2017-10-11: qty 2

## 2017-10-11 MED ORDER — OXYCODONE HCL 5 MG PO TABS: 5.0000 mg | ORAL_TABLET | ORAL | 0 refills | Status: DC | PRN

## 2017-10-11 NOTE — Discharge Instructions (Signed)
Information on my medicine - ELIQUIS (apixaban)  Why was Eliquis prescribed for you? Eliquis was prescribed to treat blood clots that may have been found in the veins of your legs (deep vein thrombosis) or in your lungs (pulmonary embolism) and to reduce the risk of them occurring again.  What do You need to know about Eliquis ? The starting dose is 10 mg (two 5 mg tablets) taken TWICE daily for the FIRST SEVEN (7) DAYS, then on (enter date)  10/18/17  the dose is reduced to ONE 5 mg tablet taken TWICE daily.  Eliquis may be taken with or without food.   Try to take the dose about the same time in the morning and in the evening. If you have difficulty swallowing the tablet whole please discuss with your pharmacist how to take the medication safely.  Take Eliquis exactly as prescribed and DO NOT stop taking Eliquis without talking to the doctor who prescribed the medication.  Stopping may increase your risk of developing a new blood clot.  Refill your prescription before you run out.  After discharge, you should have regular check-up appointments with your healthcare provider that is prescribing your Eliquis.    What do you do if you miss a dose? If a dose of ELIQUIS is not taken at the scheduled time, take it as soon as possible on the same day and twice-daily administration should be resumed. The dose should not be doubled to make up for a missed dose.  Important Safety Information A possible side effect of Eliquis is bleeding. You should call your healthcare provider right away if you experience any of the following: ? Bleeding from an injury or your nose that does not stop. ? Unusual colored urine (red or dark brown) or unusual colored stools (red or black). ? Unusual bruising for unknown reasons. ? A serious fall or if you hit your head (even if there is no bleeding).  Some medicines may interact with Eliquis and might increase your risk of bleeding or clotting while on  Eliquis. To help avoid this, consult your healthcare provider or pharmacist prior to using any new prescription or non-prescription medications, including herbals, vitamins, non-steroidal anti-inflammatory drugs (NSAIDs) and supplements.  This website has more information on Eliquis (apixaban): http://[email or web address].com/eliquis/home

## 2017-10-11 NOTE — Discharge Summary (Signed)
Physician Discharge Summary  Meagan Clark ZOX:[id number] DOB: 1996/04/21 DOA: 10/09/2017  PCP: Patient, No Pcp Per  Admit date: 10/09/2017 Discharge date: 10/11/2017  Admitted From: Home Disposition: Home   Recommendations for Outpatient Follow-up:  1. Establish care with a hospital follow up appointment with PCP in 1-2 weeks.  2. Discharged on eliquis for PE, hypercoagulability panel pending at discharge.  3. Follow up CBC for microcytic anemia, consider iron supplement (panel pending at discharge).   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 She'd been diagnosed in late September with pneumonia without significant improvement over the next month despite antibiotics. She presented to urgent care where she was found to have a positive D-dimer and was sent to the ED where CTA demonstrated PE on the right main pulmonary artery extending distally with likely infarct and leftward nonocclusive extension and no evidence of right heart strain. She is not hypoxic at rest, but has pleuritic chest pain and exertional dyspnea. Heparin was started after UPT and FOBT were engative, and she was admitted to the SDU. She has remained hemodynamically stable and had no evidence of bleeding as heparin infusion was converted to eliquis.   Discharge Diagnoses:  Principal Problem:   Pulmonary embolism (HCC) Active Problems:   Anemia   Hyponatremia  Pulmonary emboli: Significant clot burden occluding right main pulmonary artery with possible infarct. Fortunately she has compensated very well. In setting of IBD. LE dopplers negative. No right heart strain by CTA or echo.  - Due to h/o IBD, will Rx eliquis  to reduce GI bleeding risk. Pt aware of need for strict adherence to BID dosing. This is covered under insurance. Pharmacy has educated pt and family, and she has tolerated it.  - Hypercoagulable panel was sent.  Ulcerative colitis: Stable. FOBT negative.  - Continue mesalamine - Monitor for GI bleeding  Microcytic anemia:  - Stable, monitor CBC at follow up.  - Consider iron supplement (iron panel pending at discharge).   Leukocytosis: Uncertain significance, though pt has productive sputum, radiologic opacification (infarct vs. infiltrate), and is at risk for pneumonia.  - Will Rx levaquin x5 days for cover CAP. No interactions found between this and eliquis on review.   Discharge Instructions Discharge Instructions    Discharge instructions   Complete by:  As directed    You were admitted for a large pulmonary embolism which is causing shortness of breath and chest pain. Blood thinners were started, and you will continue these. Symptoms are expected to resolve slowly over the course of weeks.  - Continue eliquis (use the starter pack and take as directed (10mg  twice daily x7 days, then 5mg  twice daily). - It is also possible that you have a pneumonia, so you should take levaquin for 5 total days (first dose given here) - Follow up with [place] primary care as soon as possible and absolutely within the next month.  - If your symptoms get worse and not the same or better, seek medical attention right away.     Allergies as of 10/11/2017      Reactions   Penicillins Hives   Has patient had a PCN reaction causing immediate rash, facial/tongue/throat swelling, SOB or lightheadedness with hypotension:no Has patient had a PCN  reaction causing severe rash involve mucus membranes or skin necrosis: no Has patient had a PCN reaction that required hospitalization: no Has patient had a PCN reaction occurring within the last 10 years:no If all of the above answers are "NO", then may proceed  with Cephalosporin use.      Medication List    TAKE these medications   acetaminophen 325 MG tablet Commonly known as:  TYLENOL Take 650 mg by mouth every 6 (six) hours as needed for mild pain.   amphetamine-dextroamphetamine 15 MG tablet Commonly known as:  ADDERALL Take 15 mg by mouth every evening. Pt takes for night classes when xr wears off   amphetamine-dextroamphetamine 15 MG 24 hr capsule Commonly known as:  ADDERALL XR Take 15 mg by mouth daily. Pt ONLY takes when needed for school   ELIQUIS STARTER PACK 5 MG Tabs Take as directed on package: start with two-5mg  tablets twice daily for 7 days. On day 8, switch to one-5mg  tablet twice daily.   levofloxacin 500 MG tablet Commonly known as:  LEVAQUIN Take 1 tablet (500 mg total) daily by mouth.   mesalamine 1.2 g EC tablet Commonly known as:  LIALDA Take 2.4 g by mouth daily with breakfast.   oxyCODONE 5 MG immediate release tablet Commonly known as:  Oxy IR/ROXICODONE Take 1-2 tablets (5-10 mg total) every 4 (four) hours as needed by mouth for moderate pain or severe pain.   VSL#3 Caps Take 1 capsule by mouth daily.      Follow-up Information    [facility]. Schedule an appointment as soon as possible for a visit.          Allergies  Allergen Reactions  . Penicillins Hives    Has patient had a PCN reaction causing immediate rash, facial/tongue/throat swelling, SOB or lightheadedness with hypotension:no Has patient had a PCN reaction causing severe rash involve mucus membranes or skin necrosis: no Has patient had a PCN reaction that required hospitalization: no Has patient had a PCN reaction occurring within the last 10 years:no If all of the above answers are "NO", then may proceed with Cephalosporin use.     Consultations:  None  Procedures/Studies: Ct Angio Chest Pe W Or Wo Contrast  Result Date: 10/09/2017 CLINICAL DATA:  Progressive shortness of breath. Chest pressure. Positive  D-dimer. EXAM: CT ANGIOGRAPHY CHEST WITH CONTRAST TECHNIQUE: Multidetector CT imaging of the chest was performed using the standard protocol during bolus administration of intravenous contrast. Multiplanar CT image reconstructions and MIPs were obtained to evaluate the vascular anatomy. CONTRAST:  100 mL Isovue 370 COMPARISON:  None. FINDINGS: Cardiovascular: The heart size is normal. There is no evidence for right heart strain. Aorta and great vessel origins are within normal limits. Pulmonary artery opacification is excellent. There is a large embolus occluding the right main pulmonary artery and extending into the lobar and segmental branch vessels on the right. The embolus spans the carina extending into the left main pulmonary artery without proximal occlusion. No definite distal emboli are present. Mediastinum/Nodes: No significant mediastinal or axillary adenopathy is present. The esophagus is within normal limits. Lungs/Pleura: Scattered areas of ground-glass attenuation are present in the right lung, likely reflect atelectasis. There is focal peripheral based airspace disease in the posterior right lower lobe 16 mm, likely pulmonary infarct. A right pleural effusion is present. The left lung is clear. Upper Abdomen: Limited imaging the abdomen is unremarkable. Musculoskeletal: No chest wall abnormality. No acute or significant osseous findings. Review of the  MIP images confirms the above findings. IMPRESSION: 1. Pulmonary artery embolism occluding the right main pulmonary artery extending into the branch vessels. 2. Associated atelectasis and possible posterior right lower lobe pulmonary infarct. 3. Right pleural effusion is likely reactive. 4. Pulmonary embolus extends 1 cm into the left main pulmonary artery without occlusion. No definite distal emboli are present on the left. 5. No evidence for right heart strain. Critical Value/emergent results were called by telephone at the time of interpretation on  10/09/2017 at 3:27 pm to Dr. Sherre Poot , who verbally acknowledged these results. Electronically Signed   By: Marin Roberts M.D.   On: 10/09/2017 15:34   Subjective: Dyspnea is stable, able to ambulate halls with assistance, significant dyspnea. Has significant right chest pain worse with breathing this morning improved but not adequately controlled on oxycodone at current dose.   Discharge Exam: Vitals:   10/10/17 2021 10/11/17 0605  BP: 117/64 (!) 112/56  Pulse: 92 83  Resp: 18 18  Temp: 98.1 F (36.7 C) 98.1 F (36.7 C)  SpO2: 100% 94%   General: Pt is alert, awake, not in acute distress Cardiovascular: RRR, S1/S2 +, no rubs, no gallops, no JVD or LE edema Respiratory: CTA bilaterally, no wheezing, no rhonchi Abdominal: Soft, NT, ND, bowel sounds + Extremities: Legs appear normal and no tenderness on palpation.  Labs: Basic Metabolic Panel: Recent Labs  Lab 10/09/17 1659 10/10/17 0151  NA 134* 137  K 3.7 3.4*  CL 102 103  CO2 24 25  GLUCOSE 90 103*  BUN 13 14  CREATININE 0.69 0.74  CALCIUM 9.2 9.1   Liver Function Tests: Recent Labs  Lab 10/09/17 1659 10/10/17 0151  AST 15 17  ALT 12* 11*  ALKPHOS 88 93  BILITOT 0.3 0.5  PROT 8.0 8.2*  ALBUMIN 3.8 3.7   CBC: Recent Labs  Lab 10/09/17 1659 10/10/17 0151 10/11/17 0700  WBC 14.2* 16.9* 15.5*  NEUTROABS 9.4*  --   --   HGB 10.3* 9.9* 11.5*  HCT 34.2* 33.3* 38.4  MCV 67.3* 68.1* 69.8*  PLT 510* 522* 547*    Time coordinating discharge: Approximately 40 minutes  Hazeline Junker, MD  [facility] 10/11/2017, 4:42 PM Pager [phone]

## 2017-10-11 NOTE — Progress Notes (Signed)
Discharge instructions and medications discussed with patient and mother.  Prescriptions and AVS given to patient.  MD letter and report given to patient.  All questions answered.

## 2017-10-12 LAB — LUPUS ANTICOAGULANT PANEL
DRVVT: 50.3 s — AB (ref 0.0–47.0)
PTT LA: 57 s — AB (ref 0.0–51.9)

## 2017-10-12 LAB — HEXAGONAL PHASE PHOSPHOLIPID: Hexagonal Phase Phospholipid: 8 s (ref 0–11)

## 2017-10-12 LAB — PTT-LA MIX: PTT-LA Mix: 52.2 s — ABNORMAL HIGH (ref 0.0–48.9)

## 2017-10-12 LAB — DRVVT MIX: dRVVT Mix: 44.7 s (ref 0.0–47.0)

## 2017-10-12 LAB — HOMOCYSTEINE

## 2017-10-13 LAB — PROTEIN C, TOTAL: PROTEIN C, TOTAL: 102 % (ref 60–150)

## 2017-10-14 LAB — CARDIOLIPIN ANTIBODIES, IGG, IGM, IGA: ANTICARDIOLIPIN IGM: 10 [MPL'U]/mL (ref 0–12)

## 2017-10-15 LAB — BETA-2-GLYCOPROTEIN I ABS, IGG/M/A
Beta-2-Glycoprotein I IgA: <9 GPI IgA units (ref 0–25)
Beta-2-Glycoprotein I IgM: <9 GPI IgM units (ref 0–32)

## 2017-10-15 LAB — PROTHROMBIN GENE MUTATION

## 2017-10-16 LAB — FACTOR 5 LEIDEN

## 2017-10-20 LAB — CBC WITH DIFFERENTIAL/PLATELET
BASOS ABS: 0 10*3/uL (ref 0.0–0.1)
Basophils Relative: 0 %
Eosinophils Absolute: 0.8 10*3/uL — ABNORMAL HIGH (ref 0.0–0.7)
Eosinophils Relative: 5 %
HEMATOCRIT: 34.2 % — AB (ref 36.0–46.0)
Hemoglobin: 10.3 g/dL — ABNORMAL LOW (ref 12.0–15.0)
LYMPHS PCT: 22 %
Lymphs Abs: 3.1 10*3/uL (ref 0.7–4.0)
MCH: 20.3 pg — ABNORMAL LOW (ref 26.0–34.0)
MCHC: 30.1 g/dL (ref 30.0–36.0)
MCV: 67.3 fL — AB (ref 78.0–100.0)
Monocytes Absolute: 0.7 10*3/uL (ref 0.1–1.0)
Monocytes Relative: 5 %
NEUTROS PCT: 66 %
Neutro Abs: 9.4 10*3/uL — ABNORMAL HIGH (ref 1.7–7.7)
PLATELETS: 510 10*3/uL — AB (ref 150–400)
RBC: 5.08 MIL/uL (ref 3.87–5.11)
RDW: 17.8 % — ABNORMAL HIGH (ref 11.5–15.5)
WBC: 14.2 10*3/uL — AB (ref 4.0–10.5)

## 2018-11-16 IMAGING — CT CT ANGIO CHEST
2 of 8 series · 18 of 36 positions shown · IV contrast (isovue)
Comparison: None.

CLINICAL DATA: Progressive shortness of breath. Chest pressure.
Positive D-dimer.

EXAM:
CT ANGIOGRAPHY CHEST WITH CONTRAST
TECHNIQUE: Multidetector CT imaging of the chest was performed using the
standard protocol during bolus administration of intravenous
contrast. Multiplanar CT image reconstructions and MIPs were
obtained to evaluate the vascular anatomy.
CONTRAST:  100 mL Isovue 370

[Series 6: pe thins · axial · 0.72mm/px · z∈[-106,+147]mm · 17 of 283 slices shown]
[im 15/283  lung]
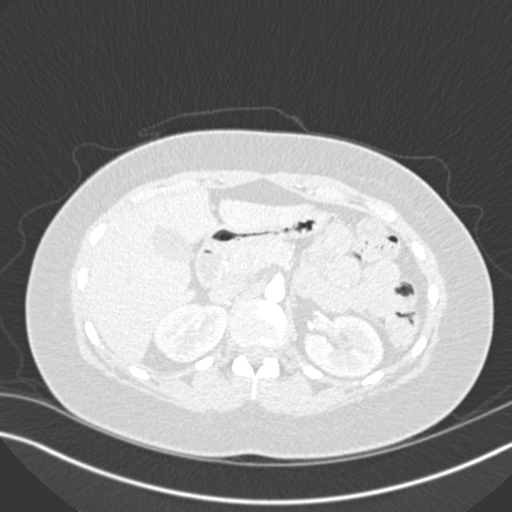
[im 30/283  mediastinal]
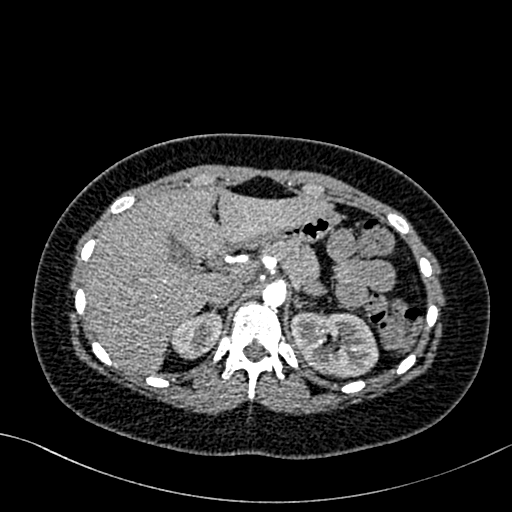
[im 45/283  lung]
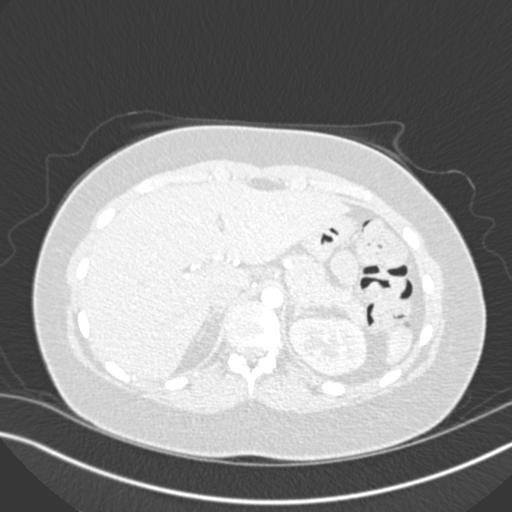
[im 60/283  mediastinal]
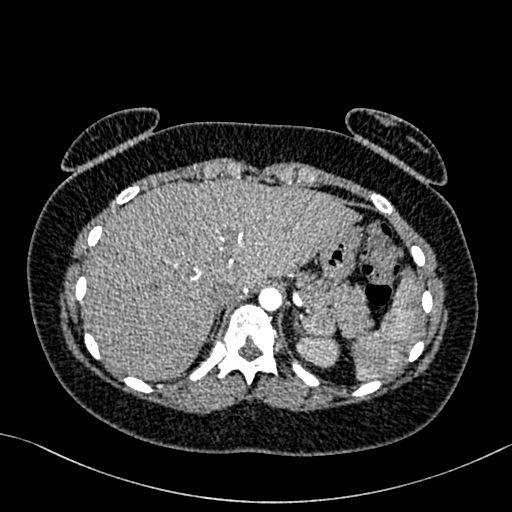
[im 75/283  lung]
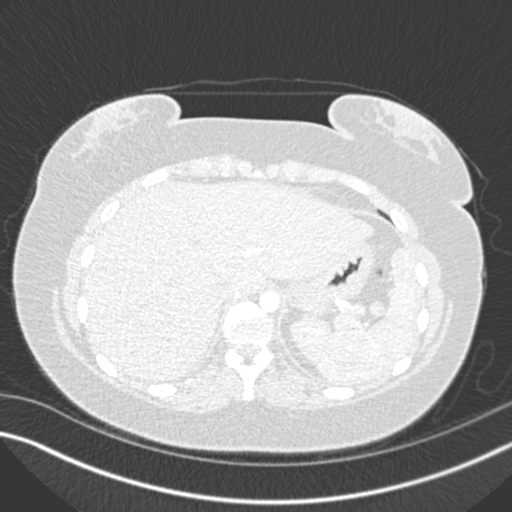
[im 90/283  mediastinal]
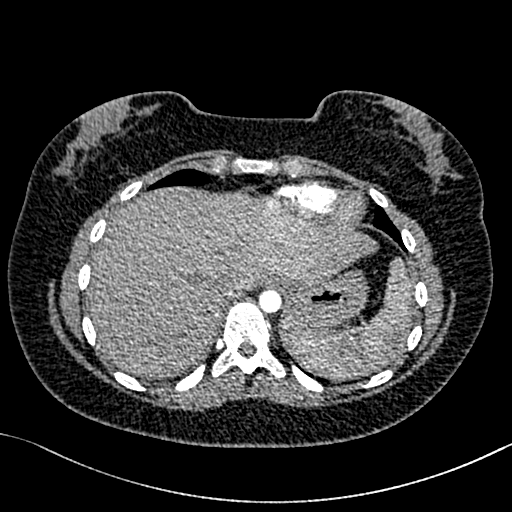
[im 104/283  lung]
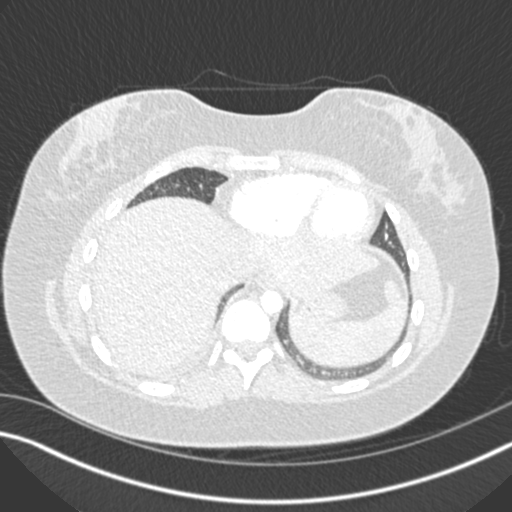
[im 119/283  mediastinal]
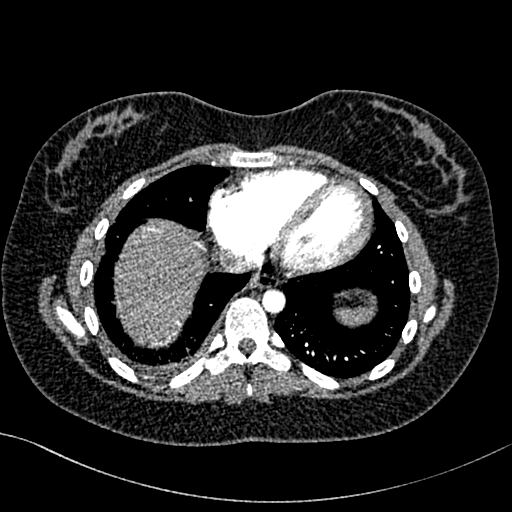
[im 149/283  lung]
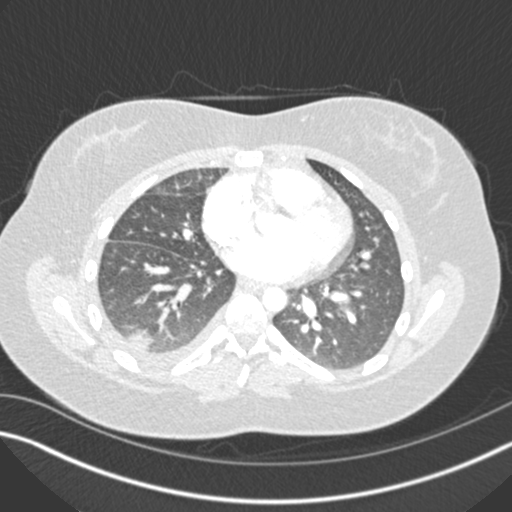
[im 164/283  mediastinal]
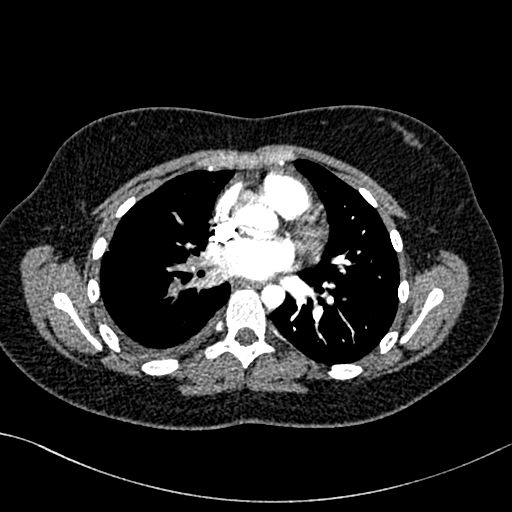
[im 179/283  lung]
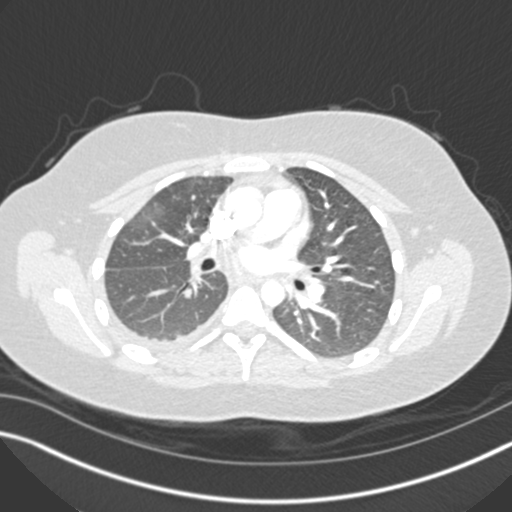
[im 193/283  mediastinal]
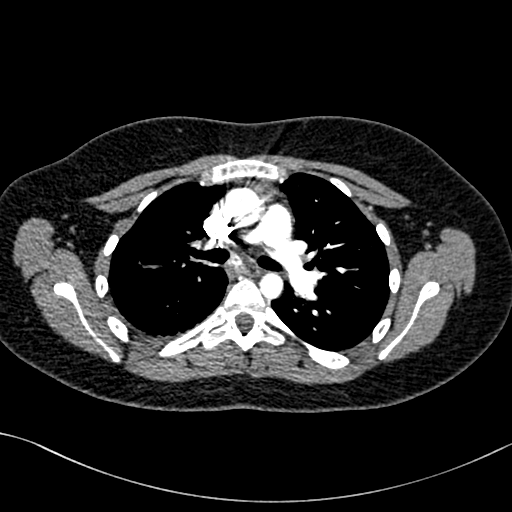
[im 208/283  lung]
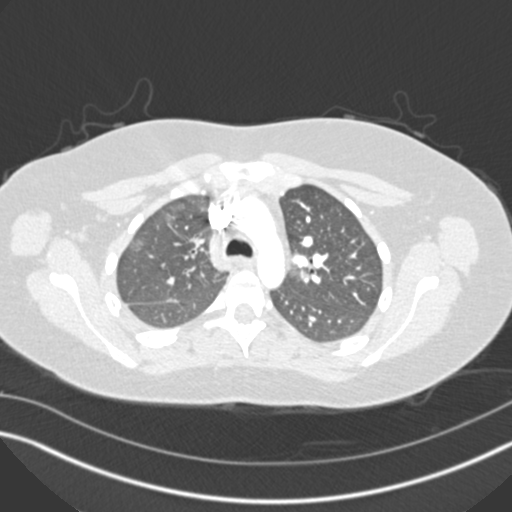
[im 223/283  mediastinal]
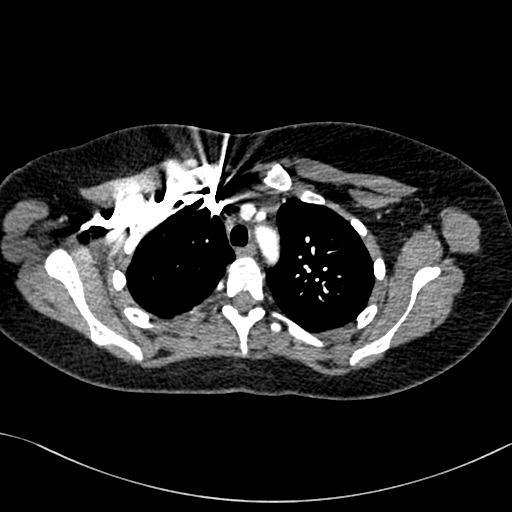
[im 238/283  lung]
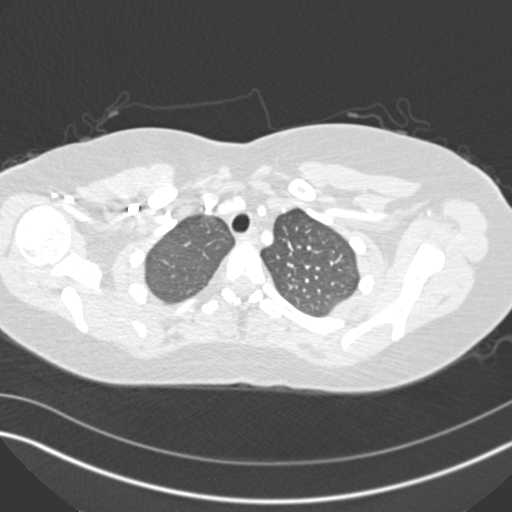
[im 253/283  mediastinal]
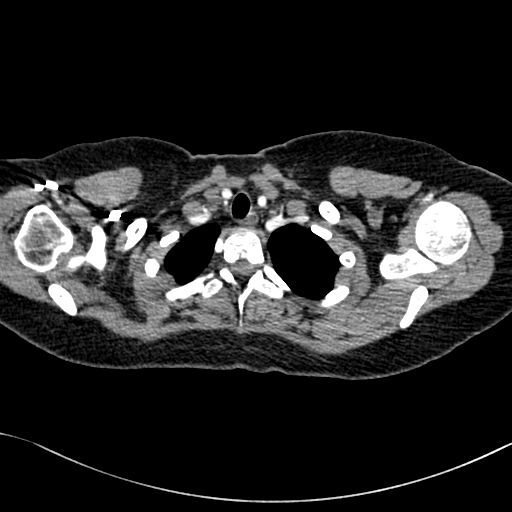
[im 268/283  lung]
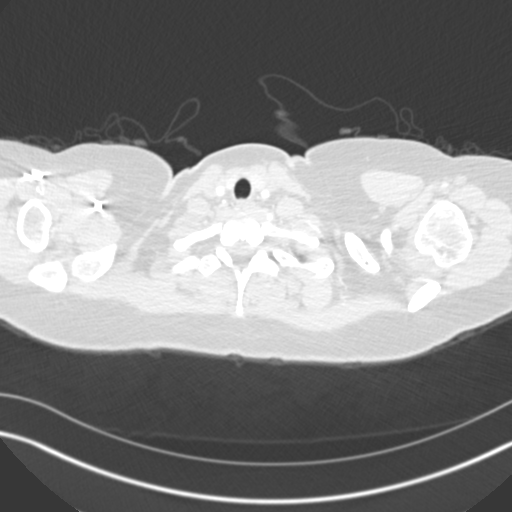

[Series 7: pe coronal mpr · coronal · 0.59mm/px · 1 of 114 slices shown]
[im 57/114  mediastinal]
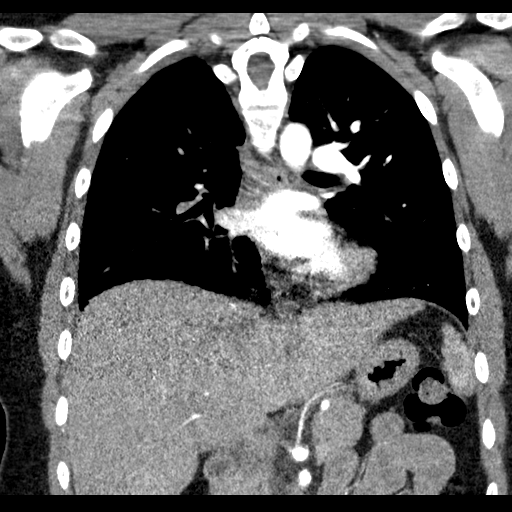

[18 of 36 positions shown; findings below may reference images not displayed]

FINDINGS: Cardiovascular: The heart size is normal. There is no evidence for
right heart strain. Aorta and great vessel origins are within normal
limits.

Pulmonary artery opacification is excellent. There is a large
embolus occluding the right main pulmonary artery and extending into
the lobar and segmental branch vessels on the right. The embolus
spans the carina extending into the left main pulmonary artery
without proximal occlusion. No definite distal emboli are present.

Mediastinum/Nodes: No significant mediastinal or axillary adenopathy
is present. The esophagus is within normal limits.

Lungs/Pleura: Scattered areas of ground-glass attenuation are
present in the right lung, likely reflect atelectasis. There is
focal peripheral based airspace disease in the posterior right lower
lobe 16 mm, likely pulmonary infarct. A right pleural effusion is
present. The left lung is clear.

Upper Abdomen: Limited imaging the abdomen is unremarkable.

Musculoskeletal: No chest wall abnormality. No acute or significant
osseous findings.

Review of the MIP images confirms the above findings.
IMPRESSION: 1. Pulmonary artery embolism occluding the right main pulmonary
artery extending into the branch vessels.
2. Associated atelectasis and possible posterior right lower lobe
pulmonary infarct.
3. Right pleural effusion is likely reactive.
4. Pulmonary embolus extends 1 cm into the left main pulmonary
artery without occlusion. No definite distal emboli are present on
the left.
5. No evidence for right heart strain.
Critical Value/emergent results were called by telephone at the time
of interpretation on 10/09/2017 at [DATE] to Dr. RUZILA THANI
, who verbally acknowledged these results.

## 2019-10-19 ENCOUNTER — Encounter (HOSPITAL_BASED_OUTPATIENT_CLINIC_OR_DEPARTMENT_OTHER): Payer: Self-pay | Admitting: Emergency Medicine

## 2019-10-19 ENCOUNTER — Other Ambulatory Visit: Payer: Self-pay

## 2019-10-19 ENCOUNTER — Emergency Department (HOSPITAL_BASED_OUTPATIENT_CLINIC_OR_DEPARTMENT_OTHER): Payer: No Typology Code available for payment source

## 2019-10-19 ENCOUNTER — Emergency Department (HOSPITAL_BASED_OUTPATIENT_CLINIC_OR_DEPARTMENT_OTHER)
Admission: EM | Admit: 2019-10-19 | Discharge: 2019-10-19 | Disposition: A | Discharge status: Home / Self Care | Payer: No Typology Code available for payment source | Attending: Emergency Medicine | Admitting: Emergency Medicine

## 2019-10-19 DIAGNOSIS — Z880 Allergy status to penicillin: Secondary | ICD-10-CM | POA: Insufficient documentation

## 2019-10-19 DIAGNOSIS — J36 Peritonsillar abscess: Secondary | ICD-10-CM

## 2019-10-19 DIAGNOSIS — Z881 Allergy status to other antibiotic agents status: Secondary | ICD-10-CM | POA: Insufficient documentation

## 2019-10-19 DIAGNOSIS — Z86711 Personal history of pulmonary embolism: Secondary | ICD-10-CM | POA: Insufficient documentation

## 2019-10-19 DIAGNOSIS — Z79899 Other long term (current) drug therapy: Secondary | ICD-10-CM | POA: Insufficient documentation

## 2019-10-19 DIAGNOSIS — R221 Localized swelling, mass and lump, neck: Secondary | ICD-10-CM | POA: Diagnosis present

## 2019-10-19 HISTORY — DX: Streptococcal pharyngitis: J02.0

## 2019-10-19 HISTORY — DX: Other pulmonary embolism without acute cor pulmonale: I26.99

## 2019-10-19 LAB — CBC WITH DIFFERENTIAL/PLATELET
Abs Immature Granulocytes: 0.03 10*3/uL (ref 0.00–0.07)
Basophils Absolute: 0.1 10*3/uL (ref 0.0–0.1)
Basophils Relative: 1 %
Eosinophils Absolute: 0 10*3/uL (ref 0.0–0.5)
Eosinophils Relative: 0 %
HCT: 48.2 % — ABNORMAL HIGH (ref 36.0–46.0)
Hemoglobin: 15 g/dL (ref 12.0–15.0)
Immature Granulocytes: 0 %
Lymphocytes Relative: 53 %
Lymphs Abs: 5 10*3/uL — ABNORMAL HIGH (ref 0.7–4.0)
MCH: 26.8 pg (ref 26.0–34.0)
MCHC: 31.1 g/dL (ref 30.0–36.0)
MCV: 86.2 fL (ref 80.0–100.0)
Monocytes Absolute: 0.8 10*3/uL (ref 0.1–1.0)
Monocytes Relative: 8 %
Neutro Abs: 3.5 10*3/uL (ref 1.7–7.7)
Neutrophils Relative %: 38 %
Platelets: 266 10*3/uL (ref 150–400)
RBC: 5.59 MIL/uL — ABNORMAL HIGH (ref 3.87–5.11)
RDW: 14.7 % (ref 11.5–15.5)
Smear Review: NORMAL
WBC Morphology: >10
WBC: 9.4 10*3/uL (ref 4.0–10.5)
nRBC: 0 % (ref 0.0–0.2)

## 2019-10-19 LAB — BASIC METABOLIC PANEL
Anion gap: 7 (ref 5–15)
BUN: 9 mg/dL (ref 6–20)
CO2: 27 mmol/L (ref 22–32)
Calcium: 9.6 mg/dL (ref 8.9–10.3)
Chloride: 103 mmol/L (ref 98–111)
Creatinine, Ser: 0.74 mg/dL (ref 0.44–1.00)
GFR calc Af Amer: >60 mL/min (ref >60)
GFR calc non Af Amer: >60 mL/min (ref >60)
Glucose, Bld: 91 mg/dL (ref 70–99)
Potassium: 3.8 mmol/L (ref 3.5–5.1)
Sodium: 137 mmol/L (ref 135–145)

## 2019-10-19 LAB — PREGNANCY, URINE: Preg Test, Ur: NEGATIVE

## 2019-10-19 LAB — MONONUCLEOSIS SCREEN: Mono Screen: NEGATIVE

## 2019-10-19 MED ORDER — SODIUM CHLORIDE 0.9 % IV BOLUS
1000.0000 mL | Freq: Once | INTRAVENOUS | Status: AC
  Administered 2019-10-19: 1000 mL via INTRAVENOUS

## 2019-10-19 MED ORDER — FENTANYL CITRATE (PF) 100 MCG/2ML IJ SOLN
50.0000 ug | Freq: Once | INTRAMUSCULAR | Status: AC
  Administered 2019-10-19: 50 ug via INTRAVENOUS
  Filled 2019-10-19: qty 2

## 2019-10-19 MED ORDER — CLINDAMYCIN HCL 300 MG PO CAPS: 300.0000 mg | ORAL_CAPSULE | Freq: Three times a day (TID) | ORAL | 0 refills | Status: AC

## 2019-10-19 MED ORDER — OXYCODONE-ACETAMINOPHEN 5-325 MG PO TABS
1.0000 | ORAL_TABLET | Freq: Once | ORAL | Status: DC
  Filled 2019-10-19: qty 1

## 2019-10-19 MED ORDER — DEXAMETHASONE SODIUM PHOSPHATE 10 MG/ML IJ SOLN
10.0000 mg | Freq: Once | INTRAMUSCULAR | Status: AC
  Administered 2019-10-19: 10 mg via INTRAVENOUS
  Filled 2019-10-19: qty 1

## 2019-10-19 MED ORDER — METHYLPREDNISOLONE 4 MG PO TBPK: ORAL_TABLET | ORAL | 0 refills | Status: AC

## 2019-10-19 MED ORDER — FENTANYL CITRATE (PF) 100 MCG/2ML IJ SOLN
50.0000 ug | Freq: Once | INTRAMUSCULAR | Status: AC
  Administered 2019-10-19: 12:00:00 50 ug via INTRAVENOUS
  Filled 2019-10-19: qty 2

## 2019-10-19 MED ORDER — SODIUM CHLORIDE 0.9 % IV SOLN
INTRAVENOUS | Status: DC | PRN
  Administered 2019-10-19: 250 mL via INTRAVENOUS

## 2019-10-19 MED ORDER — CLINDAMYCIN PHOSPHATE 600 MG/50ML IV SOLN
600.0000 mg | Freq: Once | INTRAVENOUS | Status: AC
  Administered 2019-10-19: 600 mg via INTRAVENOUS
  Filled 2019-10-19: qty 50

## 2019-10-19 MED ORDER — OXYCODONE-ACETAMINOPHEN 5-325 MG PO TABS: 1.0000 | ORAL_TABLET | Freq: Four times a day (QID) | ORAL | 0 refills | Status: AC | PRN

## 2019-10-19 MED ORDER — IOHEXOL 300 MG/ML  SOLN
100.0000 mL | Freq: Once | INTRAMUSCULAR | Status: AC | PRN
  Administered 2019-10-19: 75 mL via INTRAVENOUS

## 2019-10-19 MED FILL — CLINDAMYCIN HCL 300 MG CAPS: 300 | 10 days supply | Qty: 30 | Fill #0

## 2019-10-19 MED FILL — METHYLPREDNISOLONE 4 MG TBP: 4 | 6 days supply | Qty: 21 | Fill #0

## 2019-10-19 MED FILL — OXYCODONE-ACETAMINOPHEN 5-3: 5-325 | 4 days supply | Qty: 15 | Fill #0

## 2019-10-19 NOTE — ED Notes (Signed)
Pt reports strep 3X since September, reports that she is on a biologic and that her MD plans to remove her tonsils when she goes home for break from college. Pt c/o pain on left tonsil today with trouble swallowing. Reports no relief from hot or cold.

## 2019-10-19 NOTE — ED Notes (Signed)
Patient transported to CT 

## 2019-10-19 NOTE — ED Provider Notes (Signed)
[facility] EMERGENCY DEPARTMENT Provider Note   CSN: [id number] Arrival date & time: 10/19/19  0801     History   Chief Complaint Chief Complaint  Patient presents with  . Left side throat swelling    HPI Meagan Clark is a 23 y.o. female.  She has a history of ulcerative colitis and is on a biologic agent.  She gets strep throat very frequently and was just on a course of multiple antibiotics and steroid taper.  She is complaining of increased in tonsillar pain left greater than right over the past few days.  She said she cannot even sleep due to the pain and is having trouble swallowing her secretions.  No known fever.  She is at school at [employer] here and would like to finish out her semester in 2 weeks and go home where she is anticipated to get a tonsillectomy.     The history is provided by the patient.  Sore Throat This is a recurrent problem. The current episode started more than 2 days ago. The problem occurs constantly. The problem has been gradually worsening. Pertinent negatives include no chest pain, no abdominal pain, no headaches and no shortness of breath. The symptoms are aggravated by swallowing, drinking and eating. Nothing relieves the symptoms. The treatment provided no relief.    Past Medical History:  Diagnosis Date  . Pneumonia 10/09/2017  . Pulmonary embolism (HCC)   . Strep throat   . Ulcerative colitis (HCC) 10/09/2017    Patient Active Problem List   Diagnosis Date Noted  . Ulcerative colitis (HCC) 10/09/2017  . Pulmonary embolism (HCC) 10/09/2017  . Anemia 10/09/2017  . Hyponatremia 10/09/2017    Past Surgical History:  Procedure Laterality Date  . CARDIAC SURGERY       OB History   No obstetric history on file.      Home Medications    Prior to Admission medications   Medication Sig Start Date End Date Taking? Authorizing Provider  ARNUITY ELLIPTA 100 MCG/ACT AEPB SMARTSIG:1 Puff(s) By Mouth Daily PRN 07/16/19    [provider]  mesalamine (LIALDA) 1.2 g EC tablet Take 2.4 g by mouth daily with breakfast.     [provider]  pantoprazole (PROTONIX) 20 MG tablet Take 20 mg by mouth every morning. 09/13/19   [provider]  Probiotic Product (VSL#3) CAPS Take 1 capsule by mouth daily. 08/31/17   [provider]    Family History Family History  Problem Relation Age of Onset  . Fibromyalgia Mother     Social History Social History   Tobacco Use  . Smoking status: Never Smoker  . Smokeless tobacco: Never Used  Substance Use Topics  . Alcohol use: No  . Drug use: No     Allergies   Cephalosporins and Penicillins   Review of Systems Review of Systems  Constitutional: Negative for fever.  HENT: Positive for sore throat and trouble swallowing.   Eyes: Negative for visual disturbance.  Respiratory: Negative for shortness of breath.   Cardiovascular: Negative for chest pain.  Gastrointestinal: Negative for abdominal pain.  Genitourinary: Negative for dysuria.  Musculoskeletal: Positive for neck pain. Negative for back pain.  Skin: Negative for rash.  Neurological: Negative for headaches.     Physical Exam Updated Vital Signs BP 134/84 (BP Location: Left Arm)   Pulse (!) 115   Temp 99 F (37.2 C) (Oral)   Resp 20   Ht 5\' 5"  (1.651 m)   Wt  82.1 kg   LMP 10/12/2019   SpO2 99%   BMI 30.10 kg/m   Physical Exam Vitals signs and nursing note reviewed.  Constitutional:      General: She is not in acute distress.    Appearance: She is well-developed.  HENT:     Head: Normocephalic and atraumatic.     Mouth/Throat:     Mouth: Mucous membranes are moist.     Tongue: No lesions.     Palate: No mass.     Pharynx: No uvula swelling.     Tonsils: No tonsillar exudate or tonsillar abscesses. 4+ on the right. 4+ on the left.     Comments: Large tonsils.  Uvula midline.  No palate deviation.  Normal phonation.  No stridor. Eyes:      Conjunctiva/sclera: Conjunctivae normal.  Neck:     Musculoskeletal: Neck supple.  Cardiovascular:     Rate and Rhythm: Normal rate and regular rhythm.     Heart sounds: No murmur.  Pulmonary:     Effort: Pulmonary effort is normal. No respiratory distress.     Breath sounds: Normal breath sounds.  Abdominal:     Palpations: Abdomen is soft.     Tenderness: There is no abdominal tenderness.  Lymphadenopathy:     Cervical: Cervical adenopathy present.     Right cervical: No posterior cervical adenopathy.    Left cervical: No posterior cervical adenopathy.  Skin:    General: Skin is warm and dry.  Neurological:     Mental Status: She is alert.      ED Treatments / Results  Labs (all labs ordered are listed, but only abnormal results are displayed) Labs Reviewed  CBC WITH DIFFERENTIAL/PLATELET - Abnormal; Notable for the following components:      Result Value   RBC 5.59 (*)    HCT 48.2 (*)    Lymphs Abs 5.0 (*)    All other components within normal limits  BASIC METABOLIC PANEL  PREGNANCY, URINE  MONONUCLEOSIS SCREEN  PATHOLOGIST SMEAR REVIEW    EKG None  Radiology Ct Soft Tissue Neck W Contrast  Result Date: 10/19/2019 CLINICAL DATA:  Tonsil and adenoid disorder. Neck swelling. Difficulty sleeping. Multiple episodes of strep throat. EXAM: CT NECK WITH CONTRAST TECHNIQUE: Multidetector CT imaging of the neck was performed using the standard protocol following the bolus administration of intravenous contrast. CONTRAST:  41mL OMNIPAQUE IOHEXOL 300 MG/ML  SOLN COMPARISON:  None. FINDINGS: Pharynx and larynx: There is asymmetric enlargement of the left palatine tonsil. Both palatine tonsils are enlarged. Prominent adenoid tissue is noted as well. A peripherally enhancing hypodense area is noted adjacent to the left palatine tonsil measuring 11 x 7 x 5 mm. There is significant edema surrounding the left palatine tonsil extending into the parapharyngeal space. Edema extends  into the left vallecula and aryepiglottic fold. There is no significant airway compromise. Vocal cords are midline and within normal limits. Trachea is normal. Salivary glands: Edematous changes of the left submandibular gland are likely secondary to the tonsil and peritonsillar inflammation. There is no duct dilation. The right submandibular gland is normal. Parotid glands and ducts are normal bilaterally. Thyroid: Normal. Lymph nodes: Bilateral reactive cervical adenopathy is present, left greater than right. No necrotic nodes are present. Vascular: No significant vascular disease or stenosis is present. Limited intracranial: Within normal limits Visualized orbits: The globes and orbits are within normal limits. Mastoids and visualized paranasal sinuses: The paranasal sinuses and mastoid air cells are clear. Skeleton: Vertebral body  heights and alignment are normal. No focal lytic or blastic lesions are present. Upper chest: The patient is status post median sternotomy. Lung apices are clear. Thoracic inlet is otherwise normal. IMPRESSION: 1. Enlarged palatine tonsils and adenoid tissue bilaterally consistent with acute pharyngitis. 2. Asymmetric enlargement of the left palatine tonsil with a peripherally enhancing hypodense area measuring 11 x 7 x 5 mm compatible with a developing abscess. 3. Extensive inflammatory changes extend into the left parapharyngeal space without significant airway compromise. 4. Bilateral reactive cervical adenopathy, left greater than right. Electronically Signed   By: Marin Roberts M.D.   On: 10/19/2019 10:54    Procedures Procedures (including critical care time)  Medications Ordered in ED Medications  dexamethasone (DECADRON) injection 10 mg (10 mg Intravenous Given 10/19/19 0918)  fentaNYL (SUBLIMAZE) injection 50 mcg (50 mcg Intravenous Given 10/19/19 0920)  iohexol (OMNIPAQUE) 300 MG/ML solution 100 mL (75 mLs Intravenous Contrast Given 10/19/19 0958)  sodium  chloride 0.9 % bolus 1,000 mL ( Intravenous Stopped 10/19/19 1034)  clindamycin (CLEOCIN) IVPB 600 mg ( Intravenous Stopped 10/19/19 1237)  fentaNYL (SUBLIMAZE) injection 50 mcg (50 mcg Intravenous Given 10/19/19 1201)     Initial Impression / Assessment and Plan / ED Course  I have reviewed the triage vital signs and the nursing notes.  Pertinent labs & imaging results that were available during my care of the patient were reviewed by me and considered in my medical decision making (see chart for details).  Clinical Course as of Oct 18 1813  Wed Oct 19, 2019  1048 Patient with recurrent strep throat immune compromised here with worsening sore throat left greater than right with difficulty swallowing.  Large tonsils on exam.  Differential includes tonsillitis, peritonsillar abscess, retropharyngeal abscess, tonsillar stone.  Work-up white count normal.  Chemistries renal function normal.  Pregnancy test negative.  Monospot negative.  Patient has just come back from CT soft tissue neck with contrast.  Given steroids fluids and pain medicine.   [MB]  1114 Reviewed CT and findings with Dr. Merril Abbe from ENT.  Her recommendation is clindamycin IV dose here and 300 3 times daily x10 days, Medrol Dosepak, encourage fluids, follow-up in the clinic Thursday or Friday with the on-call doc.  Reviewed this with the patient and her mother by phone.  They are comfortable with plan.   [MB]  1259 Reviewed medications with patient.  She says she is not taking Eliquis anymore and I incorrectly told Dr. Merril Abbe.  Patient is comfortable with plan for outpatient follow-up and she says she will definitely work on keeping up with her oral intake and taking her antibiotics and steroids.  She understands to return to the emergency department for any worsening symptoms.   [MB]    Clinical Course User Index [MB] Terrilee Files, MD         Final Clinical Impressions(s) / ED Diagnoses   Final diagnoses:   Tonsillar abscess    ED Discharge Orders         Ordered    clindamycin (CLEOCIN) 300 MG capsule  3 times daily     10/19/19 1131    oxyCODONE-acetaminophen (PERCOCET/ROXICET) 5-325 MG tablet  Every 6 hours PRN     10/19/19 1131    methylPREDNISolone (MEDROL DOSEPAK) 4 MG TBPK tablet     10/19/19 1131           Terrilee Files, MD 10/19/19 1814

## 2019-10-19 NOTE — Discharge Instructions (Addendum)
Please take 1 dose of oral antibiotics today.  Start your steroids tomorrow.  Warm salt water gargles.  Pain medicine as needed.  Follow-up with ear nose throat Thursday or Friday call for appointment.  Return to the emergency department if any acute worsening of your symptoms.

## 2019-10-19 NOTE — ED Triage Notes (Signed)
Swelling to left side of throat for a week.  Has been treated recently for Strep throat.

## 2019-10-20 LAB — PATHOLOGIST SMEAR REVIEW: Path Review: 11122020
